# Patient Record
Sex: Female | Born: 1960 | Race: White | Hispanic: No
Health system: Southern US, Community
[De-identification: ages and names within clinical notes are randomized; demographics above are authoritative.]

---

## 2005-02-08 ENCOUNTER — Ambulatory Visit: Payer: Self-pay | Admitting: General Surgery

## 2006-05-07 ENCOUNTER — Emergency Department: Payer: Self-pay | Admitting: Emergency Medicine

## 2006-11-03 ENCOUNTER — Ambulatory Visit: Payer: Self-pay | Admitting: Family Medicine
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
## 2009-01-04 ENCOUNTER — Ambulatory Visit: Payer: Self-pay

## 2010-04-27 ENCOUNTER — Ambulatory Visit: Payer: Self-pay | Admitting: Family Medicine

## 2011-06-07 ENCOUNTER — Emergency Department: Payer: Self-pay | Admitting: Emergency Medicine

## 2012-05-26 ENCOUNTER — Ambulatory Visit: Payer: Self-pay | Admitting: Family Medicine
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
## 2014-05-10 ENCOUNTER — Other Ambulatory Visit: Payer: Self-pay

## 2014-05-10 DIAGNOSIS — Z1231 Encounter for screening mammogram for malignant neoplasm of breast: Secondary | ICD-10-CM

## 2014-06-01 ENCOUNTER — Ambulatory Visit: Payer: Self-pay

## 2014-06-01 ENCOUNTER — Ambulatory Visit
Admission: RE | Admit: 2014-06-01 | Discharge: 2014-06-01 | Disposition: A | Discharge status: Home / Self Care | Payer: BLUE CROSS/BLUE SHIELD | Source: Ambulatory Visit | Attending: Family Medicine | Admitting: Family Medicine

## 2014-06-01 DIAGNOSIS — Z1231 Encounter for screening mammogram for malignant neoplasm of breast: Secondary | ICD-10-CM | POA: Diagnosis not present

## 2014-06-02 ENCOUNTER — Other Ambulatory Visit: Payer: Self-pay | Admitting: Family Medicine

## 2016-05-30 IMAGING — MG MM DIGITAL SCREENING BILATERAL
4 series · 4 of 4 positions shown · non-contrast
Comparison: Previous exam(s).

CLINICAL DATA: Screening.

EXAM:
DIGITAL SCREENING BILATERAL MAMMOGRAM WITH CAD

[L MLO]
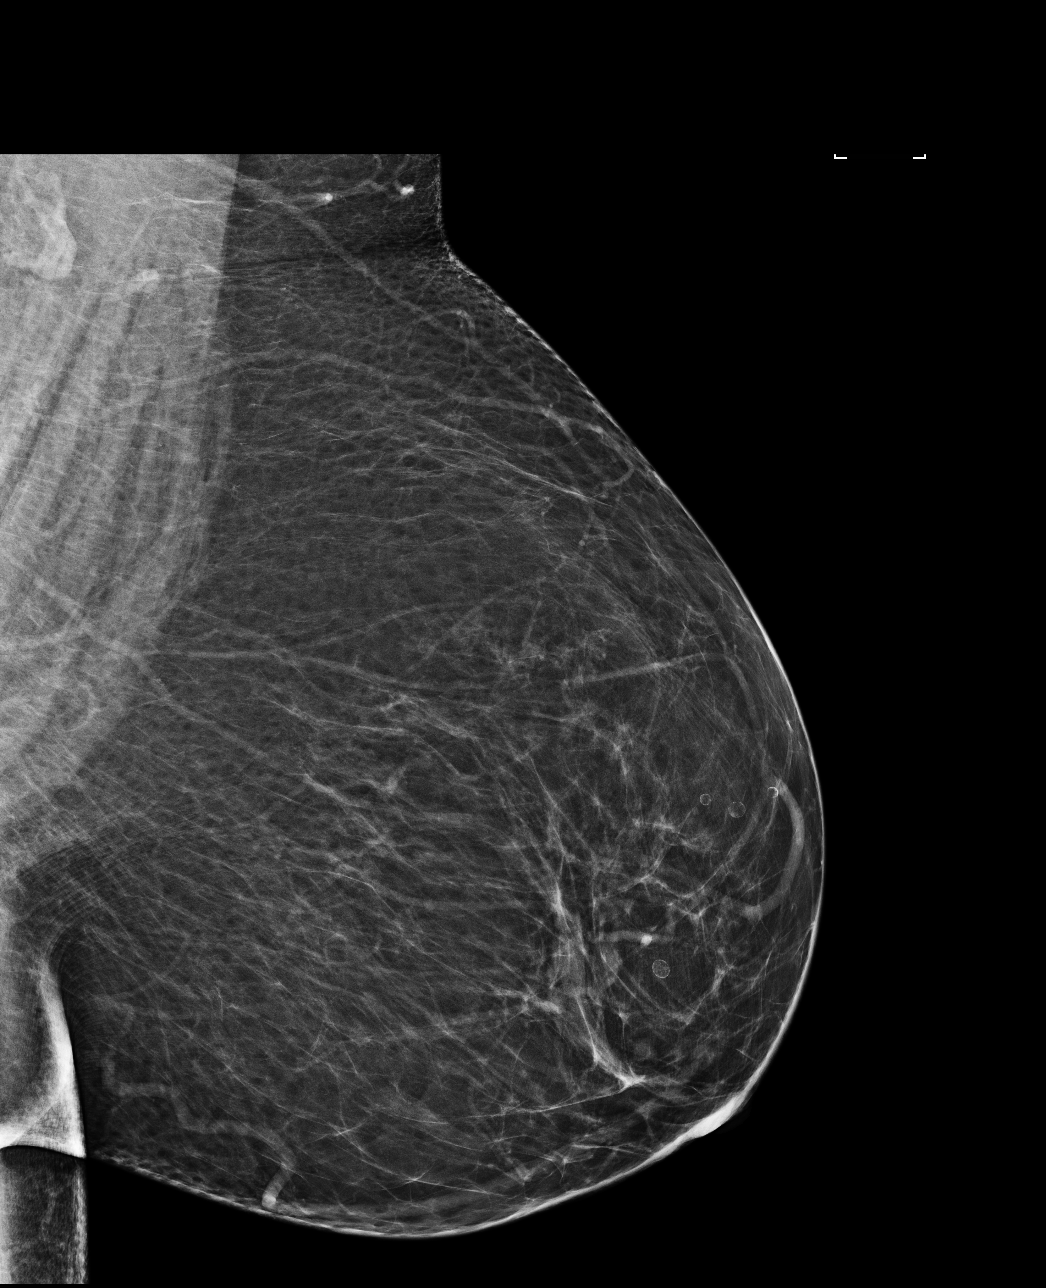

[L CC]
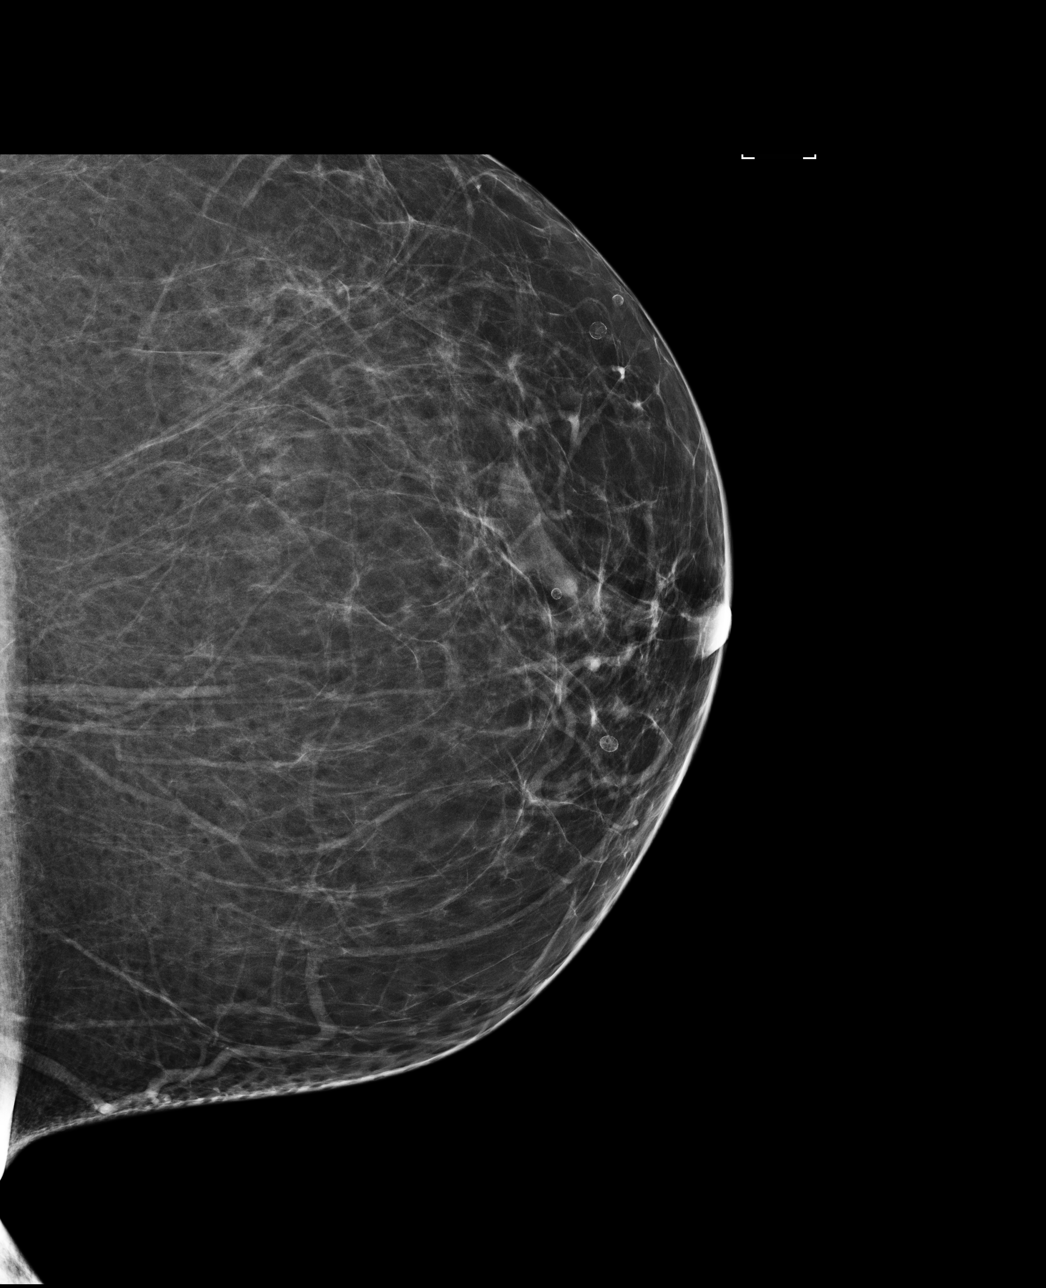

[R CC]
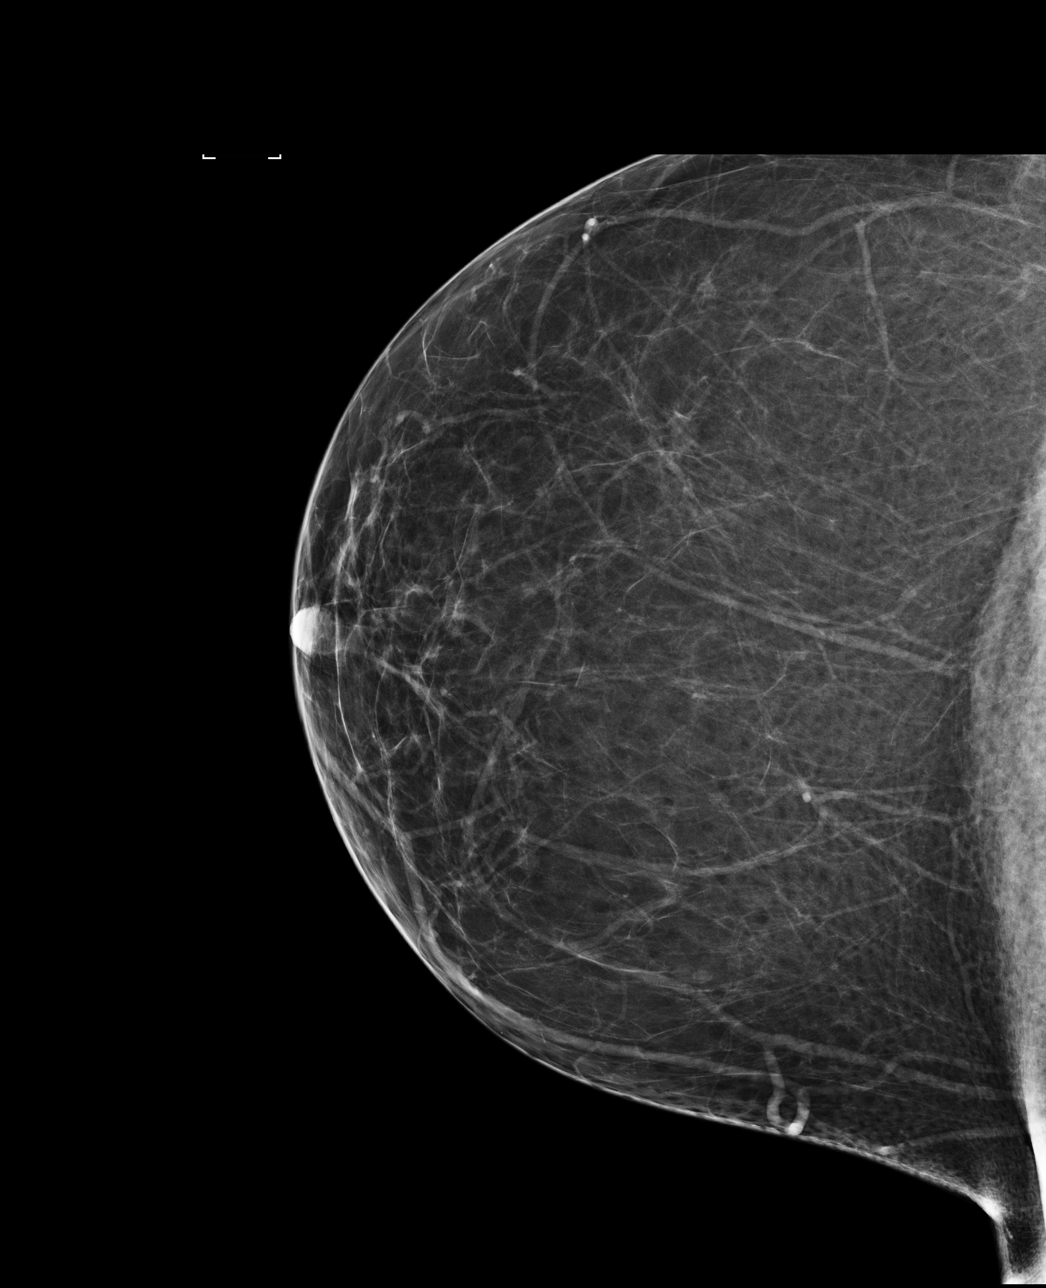

[R MLO]
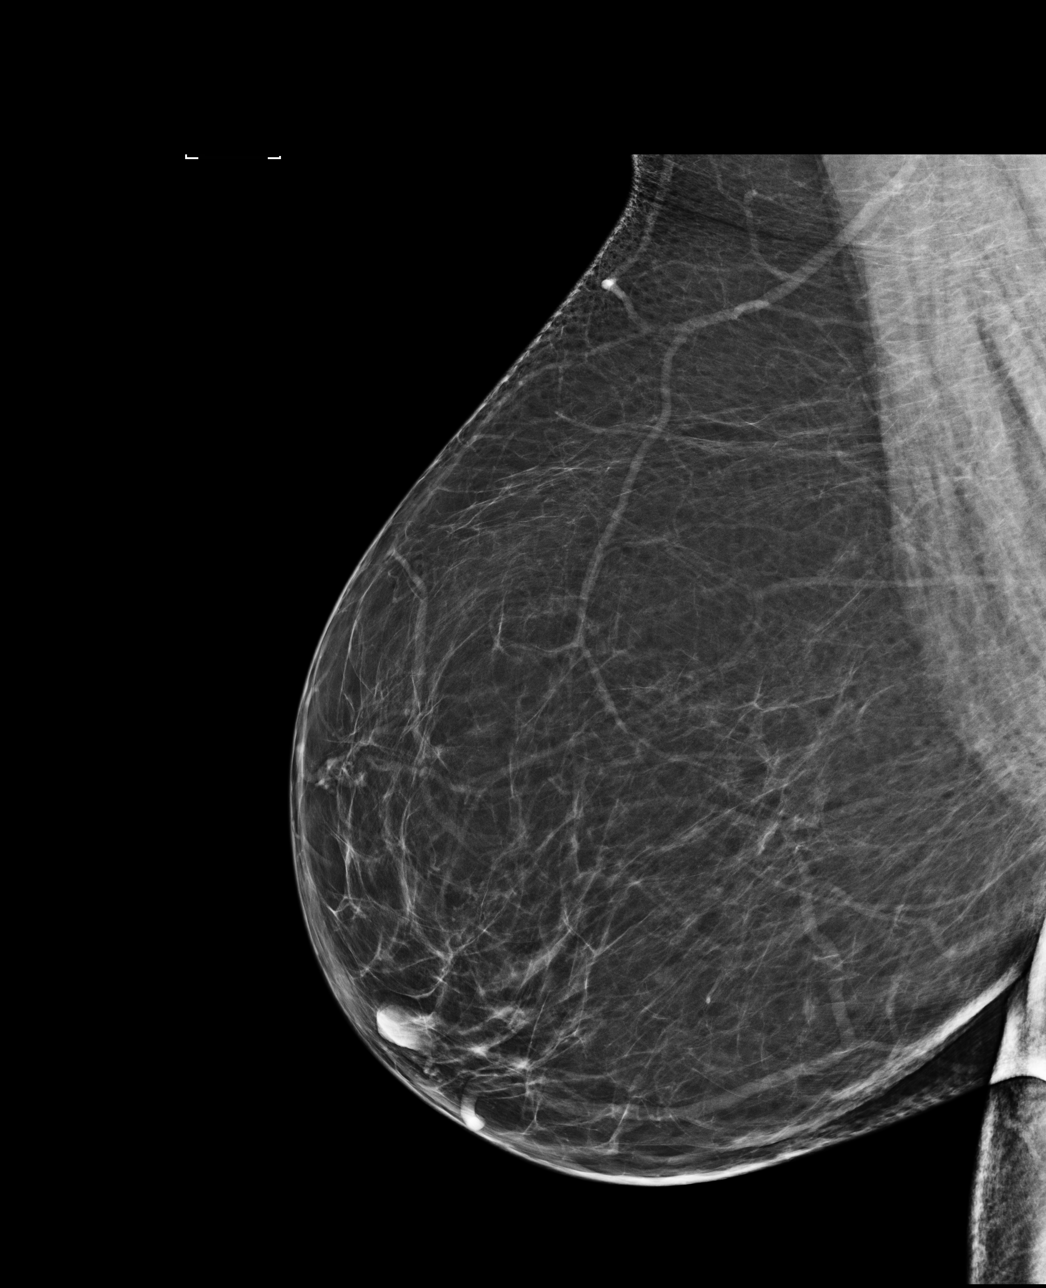

[4 of 4 positions shown; findings below may reference images not displayed]

ACR Breast Density Category b: There are scattered areas of
fibroglandular density.
FINDINGS: There are no findings suspicious for malignancy. Images were
processed with CAD.
IMPRESSION: No mammographic evidence of malignancy. A result letter of this
screening mammogram will be mailed directly to the patient.

RECOMMENDATION:
Screening mammogram in one year. (Code:AS-G-LCT)

BI-RADS CATEGORY  1: Negative.

## 2017-07-28 ENCOUNTER — Other Ambulatory Visit: Payer: Self-pay | Admitting: Obstetrics and Gynecology

## 2017-07-28 DIAGNOSIS — Z1231 Encounter for screening mammogram for malignant neoplasm of breast: Secondary | ICD-10-CM

## 2018-12-25 ENCOUNTER — Other Ambulatory Visit: Payer: Self-pay | Admitting: Family Medicine

## 2018-12-25 DIAGNOSIS — Z1231 Encounter for screening mammogram for malignant neoplasm of breast: Secondary | ICD-10-CM

## 2019-08-25 ENCOUNTER — Ambulatory Visit: Payer: Self-pay

## 2019-09-01 ENCOUNTER — Other Ambulatory Visit: Payer: Self-pay

## 2019-09-01 ENCOUNTER — Ambulatory Visit: Payer: Self-pay | Attending: Oncology | Admitting: *Deleted

## 2019-09-01 ENCOUNTER — Encounter: Payer: Self-pay | Admitting: *Deleted

## 2019-09-01 ENCOUNTER — Ambulatory Visit
Admission: RE | Admit: 2019-09-01 | Discharge: 2019-09-01 | Disposition: A | Discharge status: Home / Self Care | Payer: Self-pay | Source: Ambulatory Visit | Attending: Oncology | Admitting: Oncology

## 2019-09-01 VITALS — BP 158/91 | HR 73 | Temp 98.0°F | Ht 62.5 in | Wt 238.6 lb

## 2019-09-01 DIAGNOSIS — Z0000 Encounter for general adult medical examination without abnormal findings: Secondary | ICD-10-CM

## 2019-09-01 NOTE — Patient Instructions (Signed)
Gave patient hand-out, Women Staying Healthy, Active and Well from [facility], with education on breast health, pap smears, heart and colon health. 

## 2019-09-01 NOTE — Progress Notes (Signed)
  Subjective:     Patient ID: Lynn Murray, female   DOB: [birth year], 59 y.o.   MRN: [id number]  HPI   [facility] Medical History Record - 09/01/19 1029      Breast History   Screening cycle Rescreen    CBE Date --   unknown - "a long time ago"   Initial Mammogram 09/01/19    Last Mammogram Annual    Last Mammogram Date 06/02/14    Provider (Mammogram)  Delford Field    Recent Breast Symptoms None      Breast Cancer History   Breast Cancer History Patient and mother/daughter/sister have had breast cancer    Comments/Details Mother w/ [facility] in her 46's, maternal aunt with BC in her 36's, MGM with inoperable gyn cancer (unknown type) in her 58's.      Previous History of Breast Problems   Breast Surgery or Biopsy None    Breast Implants N/A    BSE Done See comments    Additional Comments (free text)  weekly      Gynecological/Obstetrical History   Is there any chance that the client could be pregnant?  No    Age at menarche 64    Age at menopause 69    PAP smear history Annually    Date of last PAP  06/29/18    Provider (PAP) Bernestine Amass    Age at first live birth 7    Breast fed children Yes (type length in comments)   66mo   DES Exposure Unkown    Cervical, Uterine or Ovarian cancer No    Family history of Cervial, Uterine or Ovarian cancer No    Hysterectomy No    Cervix removed No    Ovaries removed No    Laser/Cryosurgery No    Current method of birth control None    Current method of Estrogen/Hormone replacement None    Smoking history None    Comments No ins / 1 in [facility] / $350/wk            Review of Systems     Objective:   Physical Exam Chest:     Breasts:        Right: No swelling, bleeding, inverted nipple, mass, nipple discharge, skin change or tenderness.        Left: No swelling, bleeding, inverted nipple, mass, nipple discharge, skin change or tenderness.    Lymphadenopathy:     Upper Body:     Right upper body: No supraclavicular or axillary  adenopathy.     Left upper body: No supraclavicular or axillary adenopathy.        Assessment:     59 year old White female referred to [facility] by [facility] for clinical breast exam and mammogram.  Clinical breast exam unremarkable.  Taught self breast awareness.  Last pap on 06/29/18 was negative / negative.  Next pap due in 2025.  Patient has been screened for eligibility.  She does not have any insurance, Medicare or Medicaid.  She also meets financial eligibility.   Risk Assessment    Risk Scores      09/01/2019   Last edited by: Alta Corning, CMA   5-year risk: 2.8 %   Lifetime risk: 14.8 %            Plan:     Screening mammogram ordered.  Will follow up per [facility] protocol.

## 2019-09-02 ENCOUNTER — Encounter: Payer: Self-pay | Admitting: *Deleted

## 2019-09-02 NOTE — Progress Notes (Signed)
Letter mailed from the [facility] to inform patient of her normal mammogram results.  Patient is to follow-up with annual screening in one year. 

## 2019-11-09 ENCOUNTER — Other Ambulatory Visit: Payer: Self-pay

## 2019-11-10 ENCOUNTER — Ambulatory Visit: Payer: Self-pay | Admitting: Oncology

## 2020-06-21 ENCOUNTER — Other Ambulatory Visit: Payer: Self-pay | Admitting: Hematology and Oncology

## 2021-02-26 ENCOUNTER — Other Ambulatory Visit (HOSPITAL_COMMUNITY): Payer: Self-pay

## 2021-08-30 IMAGING — MG DIGITAL SCREENING BILAT W/ TOMO W/ CAD
6 of 10 series · 6 of 30 positions shown · non-contrast
Comparison: Previous exam(s).

CLINICAL DATA: Screening.

EXAM:
DIGITAL SCREENING BILATERAL MAMMOGRAM WITH TOMO AND CAD

[L MLO synth-2D (1 of 2)]
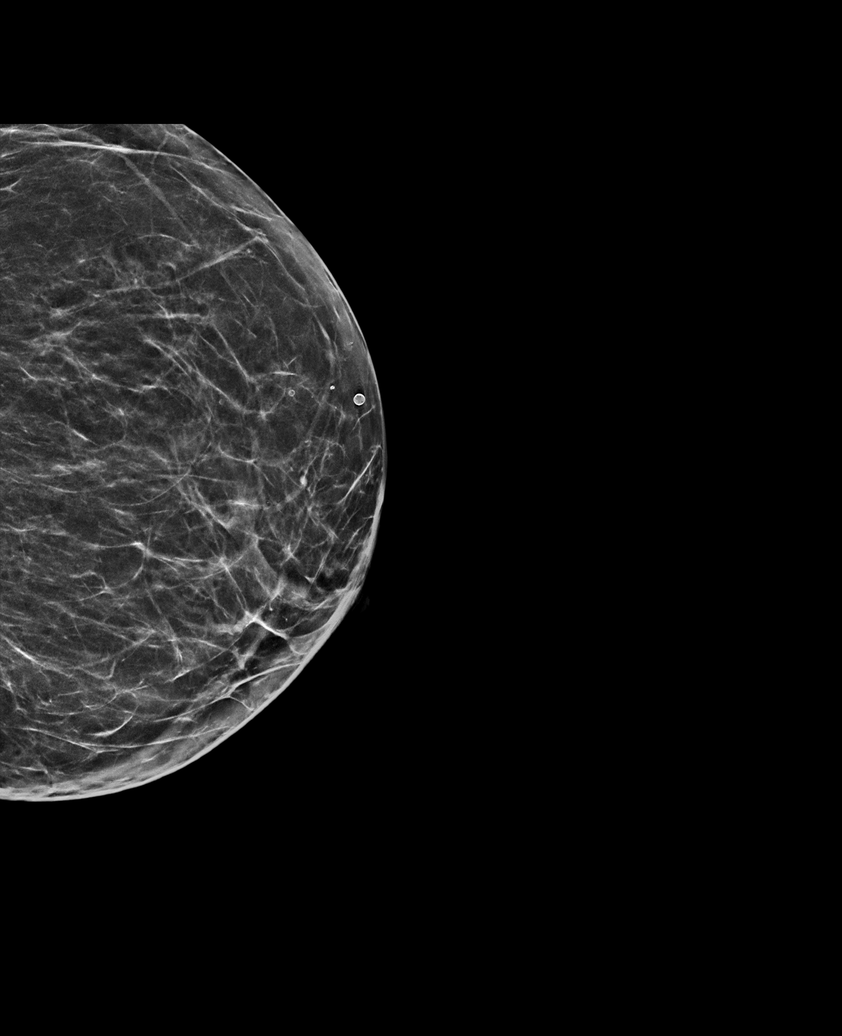

[L CC synth-2D]
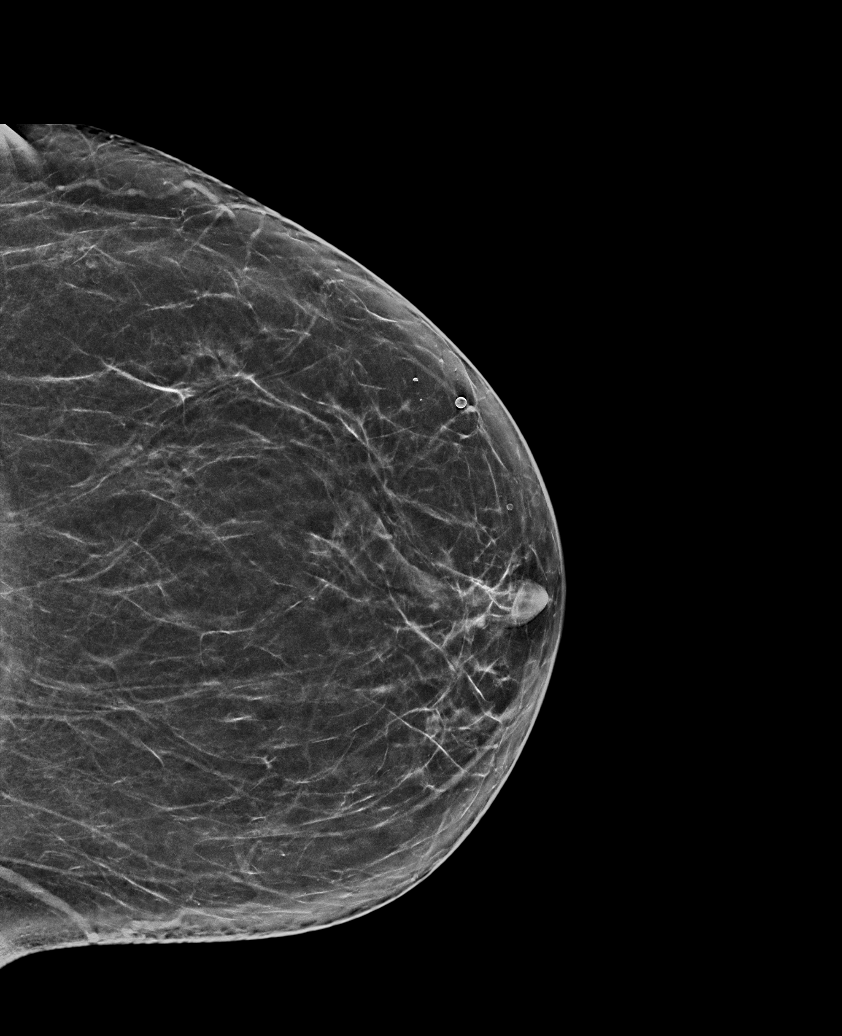

[R CC synth-2D]
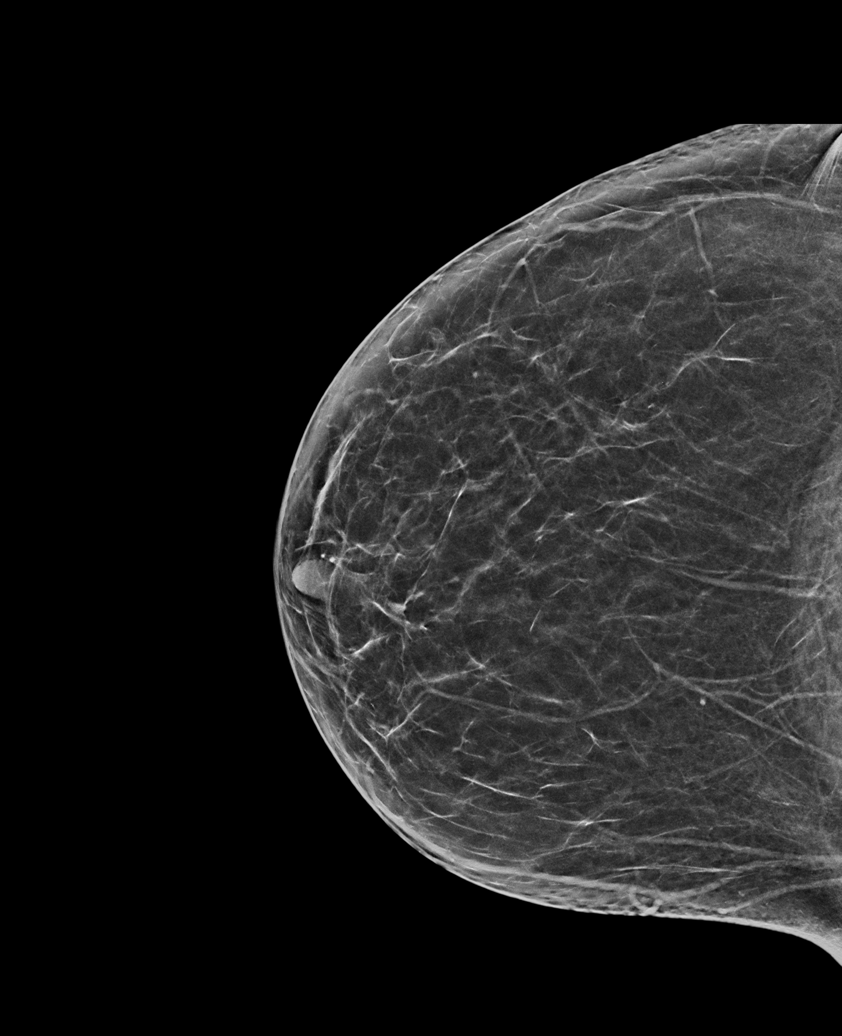

[R MLO synth-2D]
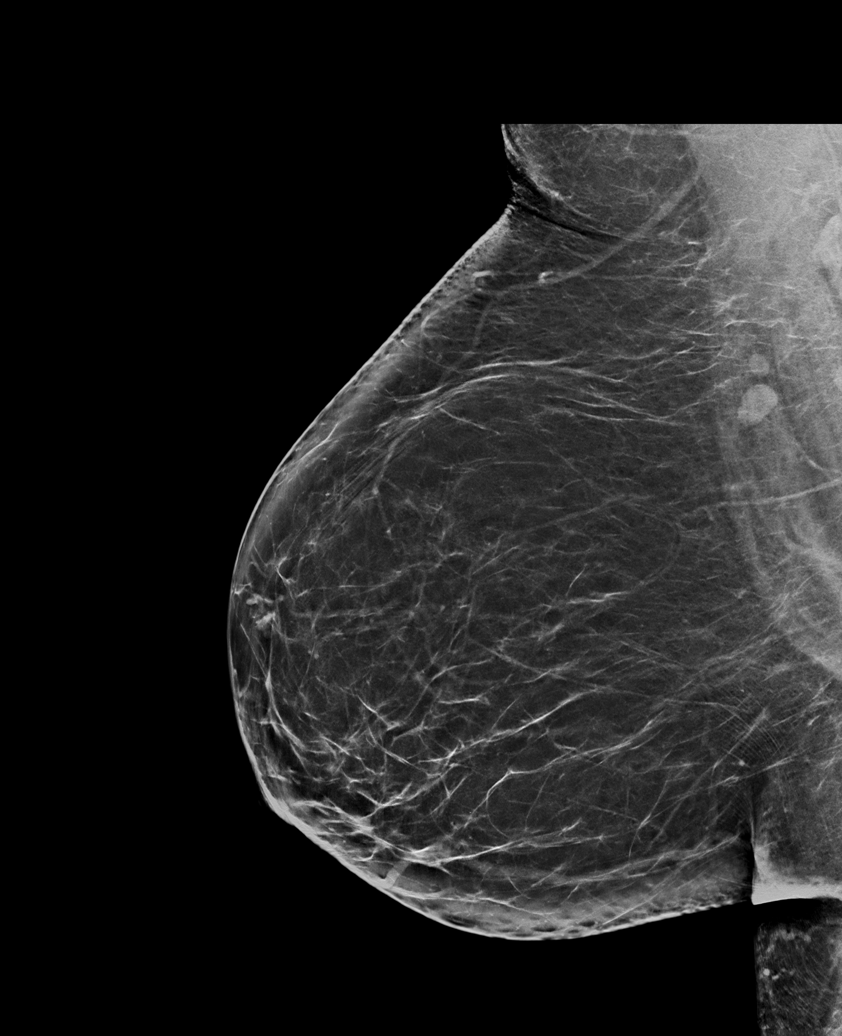

[L MLO synth-2D (2 of 2)]
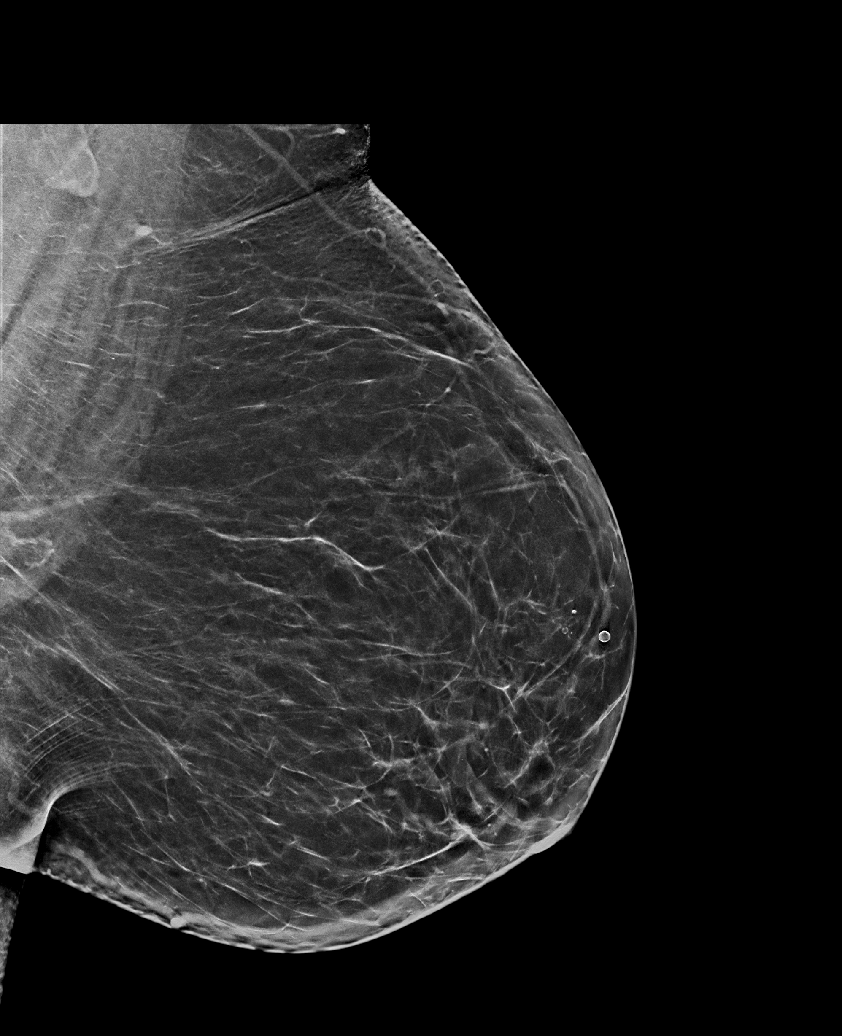

[R CC tomo · tomo slice 34/67.0]
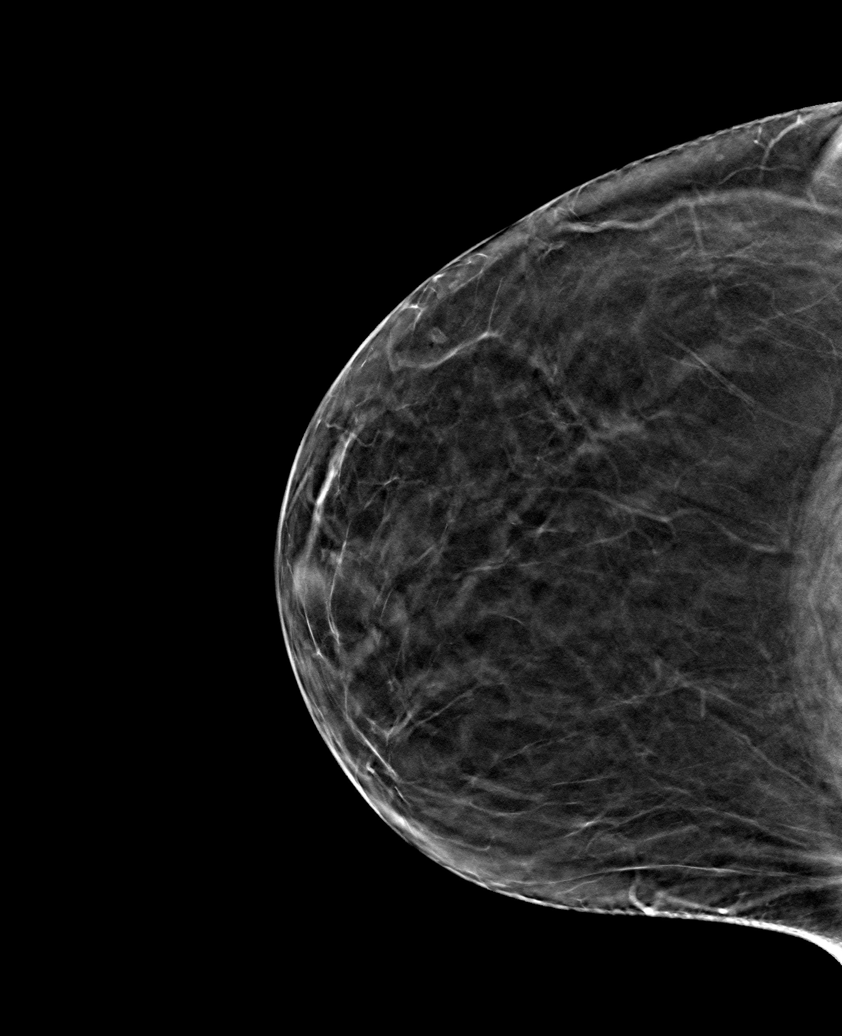

[6 of 30 positions shown; findings below may reference images not displayed]

ACR Breast Density Category b: There are scattered areas of
fibroglandular density.
FINDINGS: There are no findings suspicious for malignancy. Images were
processed with CAD.
IMPRESSION: No mammographic evidence of malignancy. A result letter of this
screening mammogram will be mailed directly to the patient.

RECOMMENDATION:
Screening mammogram in one year. (Code:[id number]-775)

BI-RADS CATEGORY  1: Negative.
